# Patient Record
Sex: Female | Born: 2000 | Race: White | Hispanic: No | Marital: Single | Smoking: Never smoker
Health system: Southern US, Community
[De-identification: ages and names within clinical notes are randomized; demographics above are authoritative.]

## PROBLEM LIST (undated history)

## (undated) DIAGNOSIS — J45909 Unspecified asthma, uncomplicated: Secondary | ICD-10-CM

## (undated) DIAGNOSIS — J302 Other seasonal allergic rhinitis: Secondary | ICD-10-CM

## (undated) HISTORY — DX: Unspecified asthma, uncomplicated: J45.909

## (undated) HISTORY — DX: Other seasonal allergic rhinitis: J30.2

---
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
## 2022-01-09 DIAGNOSIS — F39 Unspecified mood [affective] disorder: Secondary | ICD-10-CM | POA: Diagnosis not present

## 2022-01-30 ENCOUNTER — Other Ambulatory Visit: Payer: Self-pay | Admitting: *Deleted

## 2022-01-30 DIAGNOSIS — Z30011 Encounter for initial prescription of contraceptive pills: Secondary | ICD-10-CM

## 2022-01-30 MED ORDER — NORETHINDRONE ACET-ETHINYL EST 1-20 MG-MCG PO TABS: 1.0000 | ORAL_TABLET | Freq: Every day | ORAL | 0 refills | Status: DC

## 2022-02-21 ENCOUNTER — Other Ambulatory Visit: Payer: Self-pay | Admitting: Obstetrics & Gynecology

## 2022-02-21 DIAGNOSIS — Z30011 Encounter for initial prescription of contraceptive pills: Secondary | ICD-10-CM

## 2022-02-26 DIAGNOSIS — F39 Unspecified mood [affective] disorder: Secondary | ICD-10-CM | POA: Diagnosis not present

## 2022-03-02 ENCOUNTER — Ambulatory Visit (INDEPENDENT_AMBULATORY_CARE_PROVIDER_SITE_OTHER): Payer: BC Managed Care – PPO | Admitting: Family Medicine

## 2022-03-02 ENCOUNTER — Encounter: Payer: Self-pay | Admitting: Family Medicine

## 2022-03-02 DIAGNOSIS — Z30011 Encounter for initial prescription of contraceptive pills: Secondary | ICD-10-CM | POA: Diagnosis not present

## 2022-03-02 DIAGNOSIS — Z01419 Encounter for gynecological examination (general) (routine) without abnormal findings: Secondary | ICD-10-CM

## 2022-03-02 MED ORDER — NORETHINDRONE ACET-ETHINYL EST 1-20 MG-MCG PO TABS: 1.0000 | ORAL_TABLET | Freq: Every day | ORAL | 4 refills | Status: AC

## 2022-03-02 NOTE — Progress Notes (Signed)
Refill junel 

## 2022-03-02 NOTE — Progress Notes (Signed)
   GYNECOLOGY ANNUAL PREVENTATIVE CARE ENCOUNTER NOTE  Subjective:   Shelley Hampton is a 21 y.o. G0P0000 female here for a routine annual gynecologic exam.  Current complaints: likes OCP. No changes in history and wants to continue. Not plannign pregnancy and is with stable partner.   Started BH/mental health care and reports good effects of this. Denies abnormal vaginal bleeding, discharge, pelvic pain, problems with intercourse or other gynecologic concerns.    Gynecologic History No LMP recorded. (Menstrual status: Oral contraceptives). Contraception: OCP (estrogen/progesterone) Last Pap: NA.  Last mammogram: NA.   Health Maintenance Due  Topic Date Due   HPV VACCINES (1 - 2-dose series) Never done   CHLAMYDIA SCREENING  Never done   HIV Screening  Never done   Hepatitis C Screening  Never done   DTaP/Tdap/Td (1 - Tdap) Never done   INFLUENZA VACCINE  Never done    The following portions of the patient's history were reviewed and updated as appropriate: allergies, current medications, past family history, past medical history, past social history, past surgical history and problem list.  Review of Systems Pertinent items are noted in HPI.   Objective:  BP 108/74   Pulse (!) 134   Ht 4\' 8"  (1.422 m)   Wt 74 lb (33.6 kg)   BMI 16.59 kg/m  CONSTITUTIONAL: Well-developed, well-nourished female in no acute distress.  HENT:  Normocephalic, atraumatic, External right and left ear normal. Oropharynx is clear and moist EYES:  No scleral icterus.  NECK: Normal range of motion, supple, no masses.  Normal thyroid.  SKIN: Skin is warm and dry. No rash noted. Not diaphoretic. No erythema. No pallor. NEUROLOGIC: Alert and oriented to person, place, and time. Normal reflexes, muscle tone coordination. No cranial nerve deficit noted. PSYCHIATRIC: Normal mood and affect. Normal behavior. Normal judgment and thought content. CARDIOVASCULAR: Normal heart rate noted, regular rhythm. 2+  distal pulses. RESPIRATORY: Effort and breath sounds normal, no problems with respiration noted. BREASTS: deferred not indicated ABDOMEN: Soft,  no distention noted.  No tenderness, rebound or guarding.  PELVIC: deferred, not indicated MUSCULOSKELETAL: Normal range of motion.      Assessment and Plan:  1) Annual gynecologic examination:  Routine preventative health maintenance measures emphasized. Declined STI screening. Discussed recommendation for yearly GC/CT screening. Declines these today.   2) Contraception counseling: Reviewed all forms of birth control options available including abstinence; over the counter/barrier methods; hormonal contraceptive medication including pill, patch, ring, injection,contraceptive implant; hormonal and nonhormonal IUDs; permanent sterilization options including vasectomy and the various tubal sterilization modalities. Risks and benefits reviewed.  Questions were answered.  Written information was also given to the patient to review.  Patient desires OCP, this was prescribed for patient. She will follow up in  1 for surveillance.  She was told to call with any further questions, or with any concerns about this method of contraception.  Emphasized use of condoms 100% of the time for STI prevention.  1. Encounter for initial prescription of contraceptive pills - norethindrone-ethinyl estradiol (JUNEL 1/20) 1-20 MG-MCG tablet; Take 1 tablet by mouth daily.  Dispense: 84 tablet; Refill: 4   Please refer to After Visit Summary for other counseling recommendations.   Return for Yearly wellness exam.  2/20, MD, MPH, ABFM Attending [facility] for [facility]

## 2022-09-03 DIAGNOSIS — F429 Obsessive-compulsive disorder, unspecified: Secondary | ICD-10-CM | POA: Diagnosis not present

## 2022-10-03 DIAGNOSIS — F429 Obsessive-compulsive disorder, unspecified: Secondary | ICD-10-CM | POA: Diagnosis not present

## 2022-11-05 DIAGNOSIS — F429 Obsessive-compulsive disorder, unspecified: Secondary | ICD-10-CM | POA: Diagnosis not present

## 2022-12-24 ENCOUNTER — Encounter: Payer: Self-pay | Admitting: Obstetrics & Gynecology

## 2022-12-24 ENCOUNTER — Ambulatory Visit (INDEPENDENT_AMBULATORY_CARE_PROVIDER_SITE_OTHER): Payer: BC Managed Care – PPO | Admitting: Obstetrics & Gynecology

## 2022-12-24 ENCOUNTER — Other Ambulatory Visit (HOSPITAL_COMMUNITY)
Admission: RE | Admit: 2022-12-24 | Discharge: 2022-12-24 | Disposition: A | Discharge status: Home / Self Care | Payer: BC Managed Care – PPO | Source: Ambulatory Visit | Attending: Obstetrics & Gynecology | Admitting: Obstetrics & Gynecology

## 2022-12-24 VITALS — BP 100/68 | HR 101 | Wt 72.0 lb

## 2022-12-24 DIAGNOSIS — R1084 Generalized abdominal pain: Secondary | ICD-10-CM | POA: Insufficient documentation

## 2022-12-24 DIAGNOSIS — R197 Diarrhea, unspecified: Secondary | ICD-10-CM | POA: Diagnosis not present

## 2022-12-24 LAB — POCT URINALYSIS DIPSTICK

## 2022-12-24 MED ORDER — DIPHENOXYLATE-ATROPINE 2.5-0.025 MG PO TABS: 1.0000 | ORAL_TABLET | Freq: Four times a day (QID) | ORAL | 1 refills | Status: AC | PRN

## 2022-12-24 NOTE — Progress Notes (Signed)
GYNECOLOGY OFFICE VISIT NOTE  History:   Shelley Hampton is a 22 y.o. G0P0000 here today for evaluation of abdominal pain and diarrhea that started on 12/14/22.  Was seen by provider at her school, told she had bacteria in her urine and was treated with Nitrofurantoin.  She said the type of bacteria was not specified.  She reports the antibiotic made her pain and diarrhea much worse so she stopped this.  Pain is in all over abdomen, travels from lower to upper abdomen.  Not alleviated with food.  Still having diarrhea, not helped by OTC Imodium.  Pain not alleviated much by Tylenol.  She is sexually active, on OCPs.  She denies any abnormal vaginal discharge, bleeding, lower pelvic pain or other concerns.    Past Medical History:  Diagnosis Date   Asthma due to seasonal allergies    Seasonal allergies     No past surgical history on file.  The following portions of the patient's history were reviewed and updated as appropriate: allergies, current medications, past family history, past medical history, past social history, past surgical history and problem list.   Health Maintenance:  Thinks she received HPV vaccine series.  Review of Systems:  Pertinent items noted in HPI and remainder of comprehensive ROS otherwise negative.  Physical Exam:  BP 100/68   Pulse (!) 101   Wt 72 lb (32.7 kg)   LMP 11/26/2022 (Approximate)   BMI 16.14 kg/m  CONSTITUTIONAL: Well-developed, well-nourished female in no acute distress.  HEENT:  Normocephalic, atraumatic. External right and left ear normal. No scleral icterus.  NECK: Normal range of motion, supple, no masses noted on observation SKIN: No rash noted. Not diaphoretic. No erythema. No pallor. MUSCULOSKELETAL: Normal range of motion. No edema noted. NEUROLOGIC: Alert and oriented to person, place, and time. Normal muscle tone coordination. No cranial nerve deficit noted. PSYCHIATRIC: Normal mood and affect. Normal behavior. Normal judgment  and thought content. CARDIOVASCULAR: Normal heart rate noted RESPIRATORY: Effort and breath sounds normal, no problems with respiration noted ABDOMEN: Diffuse mild abdominal tenderness especially in mid and upper abdomen. No rebound or guarding. No masses noted. No other overt distention noted.   PELVIC: Deferred  Labs and Imaging Results for orders placed or performed in visit on 12/24/22 (from the past 168 hour(s))  POCT Urinalysis Dipstick   Collection Time: 12/24/22  2:14 PM  Result Value Ref Range   Color, UA     Clarity, UA     Glucose, UA     Bilirubin, UA     Ketones, UA trace    Spec Grav, UA     Blood, UA     pH, UA     Protein, UA     Urobilinogen, UA     Nitrite, UA     Leukocytes, UA     Appearance     Odor     No results found.    Assessment and Plan:    1. Diarrhea, unspecified type Lomotil prescribed.  Could be gastroenteritis.  No fevers or other symptoms other than pain. If continues, will need evaluation by GI for IBS.   - diphenoxylate-atropine (LOMOTIL) 2.5-0.025 MG tablet; Take 1-2 tablets by mouth 4 (four) times daily as needed for diarrhea or loose stools.  Dispense: 30 tablet; Refill: 1  2. Generalized abdominal pain - POCT Urinalysis Dipstick negative for UTI - Urine Culture sent - Cervicovaginal ancillary only( [place]) done, will follow up results and manage accordingly. Pain  is likely GI in origin, will monitor response to medication. No GYN etiology found yet and location does not correspond to GYN organs. Will follow up results and manage accordingly.   Return for any gynecologic concerns.    I spent 30 minutes dedicated to the care of this patient including pre-visit review of records, face to face time with the patient discussing her conditions and treatments and post visit orders.    Jaynie Collins, MD, FACOG Obstetrician & Gynecologist, [facility] for [employer], [facility]

## 2022-12-26 LAB — CERVICOVAGINAL ANCILLARY ONLY
Bacterial Vaginitis (gardnerella): POSITIVE — AB
Candida Glabrata: NEGATIVE
Candida Vaginitis: POSITIVE — AB
Chlamydia: NEGATIVE
Comment: NEGATIVE
Comment: NEGATIVE
Comment: NEGATIVE
Comment: NEGATIVE
Comment: NEGATIVE
Comment: NORMAL
Neisseria Gonorrhea: NEGATIVE
Trichomonas: NEGATIVE

## 2022-12-26 LAB — URINE CULTURE: Organism ID, Bacteria: NO GROWTH

## 2022-12-27 ENCOUNTER — Other Ambulatory Visit: Payer: Self-pay | Admitting: Obstetrics and Gynecology

## 2022-12-27 MED ORDER — METRONIDAZOLE 500 MG PO TABS: 500.0000 mg | ORAL_TABLET | Freq: Two times a day (BID) | ORAL | 0 refills | Status: AC

## 2022-12-27 MED ORDER — TERCONAZOLE 0.8 % VA CREA: 1.0000 | TOPICAL_CREAM | Freq: Every day | VAGINAL | 0 refills | Status: AC

## 2022-12-31 ENCOUNTER — Encounter (INDEPENDENT_AMBULATORY_CARE_PROVIDER_SITE_OTHER): Payer: Self-pay | Admitting: Otolaryngology

## 2023-01-11 ENCOUNTER — Encounter: Payer: Self-pay | Admitting: Obstetrics & Gynecology

## 2023-03-30 ENCOUNTER — Other Ambulatory Visit: Payer: Self-pay | Admitting: Family Medicine

## 2023-03-30 DIAGNOSIS — Z30011 Encounter for initial prescription of contraceptive pills: Secondary | ICD-10-CM
